# Patient Record
Sex: Male | Born: 1965 | Race: White | Hispanic: No | Marital: Married | Smoking: Never smoker
Health system: Southern US, Community
[De-identification: ages and names within clinical notes are randomized; demographics above are authoritative.]

---

## 1998-12-13 ENCOUNTER — Emergency Department (HOSPITAL_COMMUNITY): Admission: EM | Admit: 1998-12-13 | Discharge: 1998-12-13 | Payer: Self-pay | Admitting: Emergency Medicine

## 1998-12-13 ENCOUNTER — Encounter: Payer: Self-pay | Admitting: Emergency Medicine

## 2000-11-23 ENCOUNTER — Emergency Department (HOSPITAL_COMMUNITY): Admission: EM | Admit: 2000-11-23 | Discharge: 2000-11-23 | Payer: Self-pay | Admitting: Emergency Medicine

## 2012-03-07 ENCOUNTER — Encounter (HOSPITAL_COMMUNITY): Payer: Self-pay | Admitting: *Deleted

## 2012-03-07 ENCOUNTER — Emergency Department (HOSPITAL_COMMUNITY)
Admission: EM | Admit: 2012-03-07 | Discharge: 2012-03-07 | Disposition: A | Discharge status: Home / Self Care | Payer: No Typology Code available for payment source | Attending: Emergency Medicine | Admitting: Emergency Medicine

## 2012-03-07 ENCOUNTER — Emergency Department (HOSPITAL_COMMUNITY): Payer: No Typology Code available for payment source

## 2012-03-07 DIAGNOSIS — S0510XA Contusion of eyeball and orbital tissues, unspecified eye, initial encounter: Secondary | ICD-10-CM | POA: Insufficient documentation

## 2012-03-07 DIAGNOSIS — S0120XA Unspecified open wound of nose, initial encounter: Secondary | ICD-10-CM | POA: Insufficient documentation

## 2012-03-07 DIAGNOSIS — Y998 Other external cause status: Secondary | ICD-10-CM | POA: Insufficient documentation

## 2012-03-07 DIAGNOSIS — Y92410 Unspecified street and highway as the place of occurrence of the external cause: Secondary | ICD-10-CM | POA: Insufficient documentation

## 2012-03-07 MED ORDER — CYCLOBENZAPRINE HCL 10 MG PO TABS: 10.0000 mg | ORAL_TABLET | Freq: Two times a day (BID) | ORAL | Status: DC | PRN

## 2012-03-07 MED ORDER — OXYCODONE-ACETAMINOPHEN 5-325 MG PO TABS: 1.0000 | ORAL_TABLET | Freq: Four times a day (QID) | ORAL | Status: DC | PRN
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
## 2012-03-07 NOTE — ED Provider Notes (Signed)
Medical screening examination/treatment/procedure(s) were performed by non-physician practitioner and as supervising physician I was immediately available for consultation/collaboration.   Dione Booze, MD 03/07/12 [phone]

## 2012-03-10 ENCOUNTER — Ambulatory Visit (INDEPENDENT_AMBULATORY_CARE_PROVIDER_SITE_OTHER): Payer: 59 | Admitting: Emergency Medicine

## 2012-03-10 ENCOUNTER — Ambulatory Visit: Payer: 59

## 2012-03-10 VITALS — BP 120/77 | HR 89 | Temp 98.2°F | Resp 16 | Ht 75.38 in | Wt 260.2 lb

## 2012-03-10 DIAGNOSIS — M899 Disorder of bone, unspecified: Secondary | ICD-10-CM

## 2012-03-10 DIAGNOSIS — M545 Low back pain, unspecified: Secondary | ICD-10-CM

## 2012-03-10 DIAGNOSIS — G4700 Insomnia, unspecified: Secondary | ICD-10-CM

## 2012-03-10 DIAGNOSIS — M898X1 Other specified disorders of bone, shoulder: Secondary | ICD-10-CM

## 2012-03-10 DIAGNOSIS — M25519 Pain in unspecified shoulder: Secondary | ICD-10-CM

## 2012-03-10 DIAGNOSIS — M25512 Pain in left shoulder: Secondary | ICD-10-CM

## 2012-03-10 LAB — POCT CBC
HCT, POC: 45.5 % (ref 43.5–53.7)
Hemoglobin: 15.2 g/dL (ref 14.1–18.1)
Lymph, poc: 2.9 (ref 0.6–3.4)
MCH, POC: 30 pg (ref 27–31.2)
MCHC: 33.4 g/dL (ref 31.8–35.4)
MCV: 89.9 fL (ref 80–97)
POC Granulocyte: 6.8 (ref 2–6.9)
POC LYMPH PERCENT: 28.2 %L (ref 10–50)
RDW, POC: 14 %
WBC: 10.2 10*3/uL (ref 4.6–10.2)

## 2012-03-10 LAB — POCT URINALYSIS DIPSTICK
Bilirubin, UA: NEGATIVE
Blood, UA: NEGATIVE
Leukocytes, UA: NEGATIVE
Nitrite, UA: NEGATIVE
Protein, UA: NEGATIVE
pH, UA: 5.5

## 2012-03-10 LAB — POCT UA - MICROSCOPIC ONLY
Casts, Ur, LPF, POC: NEGATIVE
Crystals, Ur, HPF, POC: NEGATIVE
Epithelial cells, urine per micros: NEGATIVE
Yeast, UA: NEGATIVE

## 2012-03-10 MED ORDER — NABUMETONE 750 MG PO TABS: 750.0000 mg | ORAL_TABLET | Freq: Two times a day (BID) | ORAL | Status: DC

## 2012-03-10 MED ORDER — ZOLPIDEM TARTRATE 10 MG PO TABS: 10.0000 mg | ORAL_TABLET | Freq: Every evening | ORAL | Status: AC | PRN
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
## 2012-03-18 ENCOUNTER — Ambulatory Visit (INDEPENDENT_AMBULATORY_CARE_PROVIDER_SITE_OTHER): Payer: 59 | Admitting: Emergency Medicine

## 2012-03-18 DIAGNOSIS — Z043 Encounter for examination and observation following other accident: Secondary | ICD-10-CM

## 2012-03-18 DIAGNOSIS — S40019A Contusion of unspecified shoulder, initial encounter: Secondary | ICD-10-CM

## 2012-03-18 DIAGNOSIS — S20219A Contusion of unspecified front wall of thorax, initial encounter: Secondary | ICD-10-CM

## 2012-03-18 DIAGNOSIS — S8000XA Contusion of unspecified knee, initial encounter: Secondary | ICD-10-CM
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
## 2012-04-15 ENCOUNTER — Ambulatory Visit (INDEPENDENT_AMBULATORY_CARE_PROVIDER_SITE_OTHER): Payer: 59 | Admitting: Family Medicine

## 2012-04-15 VITALS — BP 118/68 | HR 109 | Temp 98.5°F | Resp 18 | Ht 74.5 in | Wt 267.2 lb

## 2012-04-15 DIAGNOSIS — M549 Dorsalgia, unspecified: Secondary | ICD-10-CM

## 2012-04-15 MED ORDER — NABUMETONE 750 MG PO TABS: 750.0000 mg | ORAL_TABLET | Freq: Two times a day (BID) | ORAL | Status: AC
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
## 2012-11-27 DIAGNOSIS — Z0271 Encounter for disability determination: Secondary | ICD-10-CM
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
## 2019-10-06 ENCOUNTER — Ambulatory Visit: Payer: 59 | Attending: Internal Medicine

## 2019-10-06 DIAGNOSIS — Z20822 Contact with and (suspected) exposure to covid-19: Secondary | ICD-10-CM

## 2019-10-08 LAB — NOVEL CORONAVIRUS, NAA: SARS-CoV-2, NAA: DETECTED — AB

## 2019-12-23 ENCOUNTER — Ambulatory Visit: Payer: 59 | Attending: Internal Medicine

## 2019-12-23 DIAGNOSIS — Z23 Encounter for immunization: Secondary | ICD-10-CM

## 2019-12-23 NOTE — Progress Notes (Signed)
   Covid-19 Vaccination Clinic  Name:  Thomas Dyer    MRN: [id number] DOB: [birth year]  12/23/2019  [name] was observed post Covid-19 immunization for 15 minutes without incident. He was provided with Vaccine Information Sheet and instruction to access the V-Safe system.   [name] was instructed to call 911 with any severe reactions post vaccine: Marland Kitchen Difficulty breathing  . Swelling of face and throat  . A fast heartbeat  . A bad rash all over body  . Dizziness and weakness   Immunizations Administered    Name Date Dose VIS Date Route   Pfizer COVID-19 Vaccine 12/23/2019  8:19 AM 0.3 mL 09/09/2019 Intramuscular   Manufacturer: [employer], [employer]   Lot: [id number]   NDC: [address]-1

## 2020-01-16 ENCOUNTER — Ambulatory Visit: Payer: 59 | Attending: Internal Medicine

## 2020-01-16 DIAGNOSIS — Z23 Encounter for immunization: Secondary | ICD-10-CM

## 2020-01-16 NOTE — Progress Notes (Signed)
   Covid-19 Vaccination Clinic  Name:  Thomas Dyer    MRN: [id number] DOB: Apr 23, 1966  01/16/2020  [name] was observed post Covid-19 immunization for 15 minutes without incident. He was provided with Vaccine Information Sheet and instruction to access the V-Safe system.   Mr. Daily was instructed to call 911 with any severe reactions post vaccine: Marland Kitchen Difficulty breathing  . Swelling of face and throat  . A fast heartbeat  . A bad rash all over body  . Dizziness and weakness   Immunizations Administered    Name Date Dose VIS Date Route   Pfizer COVID-19 Vaccine 01/16/2020  1:41 PM 0.3 mL 11/23/2018 Intramuscular   Manufacturer: [employer], [employer]   Lot: [id number]   NDC: [address]-4
